# Patient Record
Sex: Male | Born: 1990 | Race: White | Hispanic: No | Marital: Single | Smoking: Light tobacco smoker
Health system: Southern US, Community
[De-identification: ages and names within clinical notes are randomized; demographics above are authoritative.]

## PROBLEM LIST (undated history)

## (undated) DIAGNOSIS — F419 Anxiety disorder, unspecified: Secondary | ICD-10-CM

## (undated) HISTORY — DX: Anxiety disorder, unspecified: F41.9

---

## 2019-08-01 ENCOUNTER — Other Ambulatory Visit: Payer: Self-pay

## 2019-08-01 ENCOUNTER — Emergency Department (HOSPITAL_COMMUNITY)
Admission: EM | Admit: 2019-08-01 | Discharge: 2019-08-01 | Disposition: A | Discharge status: Home / Self Care | Payer: No Typology Code available for payment source | Attending: Emergency Medicine | Admitting: Emergency Medicine

## 2019-08-01 ENCOUNTER — Encounter (HOSPITAL_COMMUNITY): Payer: Self-pay | Admitting: *Deleted

## 2019-08-01 DIAGNOSIS — R002 Palpitations: Secondary | ICD-10-CM | POA: Diagnosis not present

## 2019-08-01 DIAGNOSIS — R1013 Epigastric pain: Secondary | ICD-10-CM | POA: Diagnosis not present

## 2019-08-01 DIAGNOSIS — Z720 Tobacco use: Secondary | ICD-10-CM | POA: Diagnosis not present

## 2019-08-01 DIAGNOSIS — R112 Nausea with vomiting, unspecified: Secondary | ICD-10-CM | POA: Diagnosis not present

## 2019-08-01 DIAGNOSIS — R0602 Shortness of breath: Secondary | ICD-10-CM | POA: Diagnosis not present

## 2019-08-01 NOTE — ED Provider Notes (Signed)
[facility]-EMERGENCY DEPT Provider Note: Lowella Dell, MD, FACEP  CSN: [id number] MRN: [id number] ARRIVAL: 08/01/19 at 0149 ROOM: WA12/WA12   CHIEF COMPLAINT  Palpitations   HISTORY OF PRESENT ILLNESS  08/01/19 3:05 AM Ross Mcbride is a 29 y.o. male who has been having episodes of palpitations for several months.  He describes the palpitations as a sensation of his heart beating rapidly.  His Fitbit has shown his heart rate to be as high as 210.  He has had several visits to other hospitals but they were never able to capture an arrhythmia when it occurred.  He was fitted with a heart monitor recently at a [facility] and he believes the monitor captured an episode.  He returned the monitor to the [place] yesterday but has not yet gotten results.  He is here this morning because he was watching TV earlier and had an episode of diarrhea.  This was followed by a "jolt" (which he describes as a sensation of a lump in his throat) in his chest which he describes as a 10 out of 10 which was followed by about 30 minutes of his heart beating in about 150.  He was short of breath as well.  He had some nausea and vomiting during this event.  His symptoms abated before arrival and he has had no further palpitations while in the ED.  He still have some epigastric discomfort which is mild.   History reviewed. No pertinent past medical history.  History reviewed. No pertinent surgical history.  No family history on file.  Social History   Tobacco Use  . Smoking status: Light Tobacco Smoker  Substance Use Topics  . Alcohol use: Not Currently  . Drug use: Not Currently    Prior to Admission medications   Not on File    Allergies Patient has no known allergies.   REVIEW OF SYSTEMS  Negative except as noted here or in the History of Present Illness.   PHYSICAL EXAMINATION  Initial Vital Signs Blood pressure 134/88, pulse 100, temperature 97.7 F (36.5 C), temperature source Oral,  resp. rate 18, height 5\' 11"  (1.803 m), weight 82.6 kg, SpO2 99 %.  Examination General: Well-developed, well-nourished male in no acute distress; appearance consistent with age of record HENT: normocephalic; atraumatic Eyes: pupils equal, round and reactive to light; extraocular muscles intact Neck: supple Heart: regular rate and rhythm; no murmur Lungs: clear to auscultation bilaterally Abdomen: soft; nondistended; mild subxiphoid tenderness; no masses or hepatosplenomegaly; bowel sounds present Extremities: No deformity; full range of motion; pulses normal Neurologic: Awake, alert and oriented; motor function intact in all extremities and symmetric; no facial droop Skin: Warm and dry Psychiatric: Normal mood and affect   RESULTS  Summary of this visit's results, reviewed and interpreted by myself:   EKG Interpretation  Date/Time:  Sunday August 01 2019 01:59:09 EDT Ventricular Rate:  97 PR Interval:    QRS Duration: 105 QT Interval:  347 QTC Calculation: 441 R Axis:   85 Text Interpretation: Sinus rhythm Normal ECG No previous ECGs available Confirmed by Marshia Tropea ([id number]) on 08/01/2019 3:05:21 AM      Laboratory Studies: No results found for this or any previous visit (from the past 24 hour(s)). Imaging Studies: No results found.  ED COURSE and MDM  Nursing notes, initial and subsequent vitals signs, including pulse oximetry, reviewed and interpreted by myself.  Vitals:   08/01/19 0200 08/01/19 0202  BP:  134/88  Pulse:  100  Resp:  18  Temp:  97.7 F (36.5 C)  TempSrc:  Oral  SpO2:  99%  Weight: 82.6 kg   Height: 5\' 11"  (1.803 m)    Medications - No data to display  Patient does not wish to wait for TSH testing.  I do not believe a troponin is indicated as it is likely mildly elevated due to demand ischemia.  He is already had an echocardiogram which was unremarkable and, as noted above, has worn a monitor though the results are still pending.  He was advised  to return if the palpitations are actively occurring so we can capture an image of his rhythm.  SVT would be the most likely etiology in his demographics, ventricular tachycardia would be much less likely.  PROCEDURES  Procedures   ED DIAGNOSES     ICD-10-CM   1. Palpitations  R00.2        Wren Pryce, Jenny Reichmann, MD 08/01/19 [phone]

## 2019-08-01 NOTE — ED Triage Notes (Signed)
Pt reports he was watching TV, had diarrhea, "felt a jolt in my chest", had 1 episode of vomiting. After vomiting, he says that his heart rate was 150 and stayed elevated over 30 minutes. Pt says that about 3 weeks ago, he had similar symptoms, was seen at a hospital in [place] and was given medication for anxiety.

## 2019-08-05 ENCOUNTER — Emergency Department (HOSPITAL_COMMUNITY)
Admission: EM | Admit: 2019-08-05 | Discharge: 2019-08-05 | Discharge status: Against Medical Advice | Payer: No Typology Code available for payment source | Attending: Emergency Medicine | Admitting: Emergency Medicine

## 2019-08-05 ENCOUNTER — Encounter (HOSPITAL_COMMUNITY): Payer: Self-pay

## 2019-08-05 ENCOUNTER — Emergency Department (HOSPITAL_COMMUNITY)
Admission: EM | Admit: 2019-08-05 | Discharge: 2019-08-05 | Disposition: A | Discharge status: Home / Self Care | Payer: No Typology Code available for payment source | Source: Home / Self Care

## 2019-08-05 DIAGNOSIS — M546 Pain in thoracic spine: Secondary | ICD-10-CM | POA: Insufficient documentation

## 2019-08-05 DIAGNOSIS — R197 Diarrhea, unspecified: Secondary | ICD-10-CM | POA: Diagnosis not present

## 2019-08-05 DIAGNOSIS — Z5321 Procedure and treatment not carried out due to patient leaving prior to being seen by health care provider: Secondary | ICD-10-CM | POA: Diagnosis not present

## 2019-08-05 DIAGNOSIS — R200 Anesthesia of skin: Secondary | ICD-10-CM | POA: Insufficient documentation

## 2019-08-05 DIAGNOSIS — M549 Dorsalgia, unspecified: Secondary | ICD-10-CM | POA: Diagnosis not present

## 2019-08-05 DIAGNOSIS — R1110 Vomiting, unspecified: Secondary | ICD-10-CM | POA: Insufficient documentation

## 2019-08-05 LAB — COMPREHENSIVE METABOLIC PANEL
ALT: 20 U/L (ref 0–44)
AST: 18 U/L (ref 15–41)
Albumin: 5.1 g/dL — ABNORMAL HIGH (ref 3.5–5.0)
Alkaline Phosphatase: 85 U/L (ref 38–126)
Anion gap: 10 (ref 5–15)
BUN: 11 mg/dL (ref 6–20)
CO2: 28 mmol/L (ref 22–32)
Calcium: 9.8 mg/dL (ref 8.9–10.3)
Chloride: 102 mmol/L (ref 98–111)
Creatinine, Ser: 1.09 mg/dL (ref 0.61–1.24)
GFR calc Af Amer: >60 mL/min (ref >60)
GFR calc non Af Amer: >60 mL/min (ref >60)
Glucose, Bld: 97 mg/dL (ref 70–99)
Potassium: 4.5 mmol/L (ref 3.5–5.1)
Sodium: 140 mmol/L (ref 135–145)
Total Bilirubin: 1 mg/dL (ref 0.3–1.2)
Total Protein: 8 g/dL (ref 6.5–8.1)

## 2019-08-05 LAB — CBC
HCT: 46.8 % (ref 39.0–52.0)
Hemoglobin: 15.5 g/dL (ref 13.0–17.0)
MCH: 29.4 pg (ref 26.0–34.0)
MCHC: 33.1 g/dL (ref 30.0–36.0)
MCV: 88.8 fL (ref 80.0–100.0)
Platelets: 259 10*3/uL (ref 150–400)
RBC: 5.27 MIL/uL (ref 4.22–5.81)
RDW: 12.3 % (ref 11.5–15.5)
WBC: 7.5 10*3/uL (ref 4.0–10.5)
nRBC: 0 % (ref 0.0–0.2)

## 2019-08-05 LAB — LIPASE, BLOOD: Lipase: 37 U/L (ref 11–51)

## 2019-08-05 NOTE — ED Notes (Signed)
Patient has a extra gold top in the main lab 

## 2019-08-05 NOTE — ED Triage Notes (Signed)
Patient left this facility a few hours ago. Patient reports back pain, rib pain, and more recently nausea and vomiting within the last 2 hours.

## 2019-08-05 NOTE — ED Triage Notes (Signed)
Arrived POV from home. Patient reports Thoracic back pain and numbness that started when he was getting ready for bed. Patient reports pain radiates into ribs.

## 2019-08-11 ENCOUNTER — Other Ambulatory Visit: Payer: Self-pay

## 2019-08-11 ENCOUNTER — Emergency Department (HOSPITAL_COMMUNITY)
Admission: EM | Admit: 2019-08-11 | Discharge: 2019-08-12 | Discharge status: Against Medical Advice | Payer: No Typology Code available for payment source

## 2019-08-12 NOTE — ED Triage Notes (Signed)
Pt called from triage with no answer and not seen in the lobby

## 2019-09-08 ENCOUNTER — Encounter (HOSPITAL_COMMUNITY): Payer: Self-pay

## 2019-09-08 ENCOUNTER — Emergency Department (HOSPITAL_COMMUNITY)
Admission: EM | Admit: 2019-09-08 | Discharge: 2019-09-08 | Disposition: A | Discharge status: Home / Self Care | Payer: No Typology Code available for payment source | Attending: Emergency Medicine | Admitting: Emergency Medicine

## 2019-09-08 ENCOUNTER — Other Ambulatory Visit: Payer: Self-pay

## 2019-09-08 DIAGNOSIS — Z5321 Procedure and treatment not carried out due to patient leaving prior to being seen by health care provider: Secondary | ICD-10-CM | POA: Insufficient documentation

## 2019-09-08 DIAGNOSIS — R1011 Right upper quadrant pain: Secondary | ICD-10-CM | POA: Diagnosis not present

## 2019-09-08 LAB — COMPREHENSIVE METABOLIC PANEL
ALT: 29 U/L (ref 0–44)
AST: 26 U/L (ref 15–41)
Albumin: 4.9 g/dL (ref 3.5–5.0)
Alkaline Phosphatase: 78 U/L (ref 38–126)
Anion gap: 12 (ref 5–15)
BUN: 10 mg/dL (ref 6–20)
CO2: 25 mmol/L (ref 22–32)
Calcium: 9.6 mg/dL (ref 8.9–10.3)
Chloride: 100 mmol/L (ref 98–111)
Creatinine, Ser: 0.93 mg/dL (ref 0.61–1.24)
GFR calc Af Amer: >60 mL/min (ref >60)
GFR calc non Af Amer: >60 mL/min (ref >60)
Glucose, Bld: 98 mg/dL (ref 70–99)
Potassium: 3.5 mmol/L (ref 3.5–5.1)
Sodium: 137 mmol/L (ref 135–145)
Total Bilirubin: 1 mg/dL (ref 0.3–1.2)
Total Protein: 7.5 g/dL (ref 6.5–8.1)

## 2019-09-08 LAB — CBC
HCT: 44.3 % (ref 39.0–52.0)
Hemoglobin: 14.6 g/dL (ref 13.0–17.0)
MCH: 29.3 pg (ref 26.0–34.0)
MCHC: 33 g/dL (ref 30.0–36.0)
MCV: 88.8 fL (ref 80.0–100.0)
Platelets: 222 10*3/uL (ref 150–400)
RBC: 4.99 MIL/uL (ref 4.22–5.81)
RDW: 12.4 % (ref 11.5–15.5)
WBC: 6.5 10*3/uL (ref 4.0–10.5)
nRBC: 0 % (ref 0.0–0.2)

## 2019-09-08 LAB — URINALYSIS, ROUTINE W REFLEX MICROSCOPIC
Bilirubin Urine: NEGATIVE
Glucose, UA: NEGATIVE mg/dL
Ketones, ur: NEGATIVE mg/dL
Leukocytes,Ua: NEGATIVE
Nitrite: NEGATIVE
Protein, ur: NEGATIVE mg/dL
Specific Gravity, Urine: 1.006 (ref 1.005–1.030)
pH: 7 (ref 5.0–8.0)

## 2019-09-08 LAB — LIPASE, BLOOD: Lipase: 41 U/L (ref 11–51)

## 2019-09-08 NOTE — ED Triage Notes (Signed)
Pt sts RUQ abdominal pain for 4 months.

## 2019-09-08 NOTE — ED Notes (Signed)
Pt told greeters he couldn't wait any longer and he was leaving

## 2019-09-08 NOTE — ED Triage Notes (Signed)
Pt sts RUQ abdominal pain for 4 months and thinks he has worms because his butt itches.

## 2019-09-22 ENCOUNTER — Emergency Department (HOSPITAL_COMMUNITY): Payer: No Typology Code available for payment source

## 2019-09-22 ENCOUNTER — Encounter (HOSPITAL_COMMUNITY): Payer: Self-pay | Admitting: Obstetrics and Gynecology

## 2019-09-22 ENCOUNTER — Other Ambulatory Visit: Payer: Self-pay

## 2019-09-22 ENCOUNTER — Emergency Department (HOSPITAL_COMMUNITY)
Admission: EM | Admit: 2019-09-22 | Discharge: 2019-09-22 | Disposition: A | Discharge status: Home / Self Care | Payer: No Typology Code available for payment source | Attending: Emergency Medicine | Admitting: Emergency Medicine

## 2019-09-22 DIAGNOSIS — R0789 Other chest pain: Secondary | ICD-10-CM | POA: Insufficient documentation

## 2019-09-22 DIAGNOSIS — Z5321 Procedure and treatment not carried out due to patient leaving prior to being seen by health care provider: Secondary | ICD-10-CM | POA: Diagnosis not present

## 2019-09-22 MED ORDER — SODIUM CHLORIDE 0.9% FLUSH: 3.0000 mL | Freq: Once | INTRAVENOUS | Status: DC

## 2019-09-22 NOTE — ED Triage Notes (Signed)
Pt reports right sided chest pain. Patient reports he wore a heart monitor for 2 weeks and they didn't find anything.

## 2019-11-09 ENCOUNTER — Emergency Department (HOSPITAL_COMMUNITY)
Admission: EM | Admit: 2019-11-09 | Discharge: 2019-11-09 | Disposition: A | Discharge status: Home / Self Care | Payer: No Typology Code available for payment source | Attending: Emergency Medicine | Admitting: Emergency Medicine

## 2019-11-09 ENCOUNTER — Other Ambulatory Visit: Payer: Self-pay

## 2019-11-09 ENCOUNTER — Encounter (HOSPITAL_COMMUNITY): Payer: Self-pay | Admitting: Emergency Medicine

## 2019-11-09 ENCOUNTER — Emergency Department (HOSPITAL_COMMUNITY): Payer: No Typology Code available for payment source

## 2019-11-09 DIAGNOSIS — F419 Anxiety disorder, unspecified: Secondary | ICD-10-CM | POA: Diagnosis not present

## 2019-11-09 DIAGNOSIS — Z5321 Procedure and treatment not carried out due to patient leaving prior to being seen by health care provider: Secondary | ICD-10-CM | POA: Insufficient documentation

## 2019-11-09 DIAGNOSIS — R0789 Other chest pain: Secondary | ICD-10-CM | POA: Diagnosis present

## 2019-11-09 NOTE — ED Notes (Signed)
Pt has stated that he is going to leave before evaluation. He states that he believes it was a panic attack and feels much better now. He has had them previously and he says the pain and symptoms were the same. Pt advised that the wait time was less than 15 minutes to see a provider. Pt refused and left the property. Advised to return if needed

## 2019-11-09 NOTE — ED Triage Notes (Signed)
Pt reports waking from sleep with tightness in chest and pins and needles all over. Pt states it felt like heart was racing. Pt reports taking medications for anxiety.

## 2020-01-27 ENCOUNTER — Emergency Department (HOSPITAL_COMMUNITY): Payer: BC Managed Care – PPO

## 2020-01-27 ENCOUNTER — Other Ambulatory Visit: Payer: Self-pay

## 2020-01-27 ENCOUNTER — Emergency Department (HOSPITAL_COMMUNITY)
Admission: EM | Admit: 2020-01-27 | Discharge: 2020-01-27 | Disposition: A | Discharge status: Home / Self Care | Payer: BC Managed Care – PPO | Attending: Emergency Medicine | Admitting: Emergency Medicine

## 2020-01-27 DIAGNOSIS — F17210 Nicotine dependence, cigarettes, uncomplicated: Secondary | ICD-10-CM | POA: Diagnosis not present

## 2020-01-27 DIAGNOSIS — R5381 Other malaise: Secondary | ICD-10-CM | POA: Insufficient documentation

## 2020-01-27 DIAGNOSIS — R0602 Shortness of breath: Secondary | ICD-10-CM | POA: Diagnosis not present

## 2020-01-27 DIAGNOSIS — R6889 Other general symptoms and signs: Secondary | ICD-10-CM

## 2020-01-27 DIAGNOSIS — R6883 Chills (without fever): Secondary | ICD-10-CM | POA: Diagnosis present

## 2020-01-27 DIAGNOSIS — R202 Paresthesia of skin: Secondary | ICD-10-CM | POA: Insufficient documentation

## 2020-01-27 LAB — CBC
HCT: 43.5 % (ref 39.0–52.0)
Hemoglobin: 14.4 g/dL (ref 13.0–17.0)
MCH: 29.3 pg (ref 26.0–34.0)
MCHC: 33.1 g/dL (ref 30.0–36.0)
MCV: 88.4 fL (ref 80.0–100.0)
Platelets: 214 10*3/uL (ref 150–400)
RBC: 4.92 MIL/uL (ref 4.22–5.81)
RDW: 12.6 % (ref 11.5–15.5)
WBC: 6.5 10*3/uL (ref 4.0–10.5)
nRBC: 0 % (ref 0.0–0.2)

## 2020-01-27 LAB — BASIC METABOLIC PANEL
Anion gap: 10 (ref 5–15)
BUN: 10 mg/dL (ref 6–20)
CO2: 27 mmol/L (ref 22–32)
Calcium: 9.3 mg/dL (ref 8.9–10.3)
Chloride: 100 mmol/L (ref 98–111)
Creatinine, Ser: 0.84 mg/dL (ref 0.61–1.24)
GFR, Estimated: >60 mL/min (ref >60)
Glucose, Bld: 97 mg/dL (ref 70–99)
Potassium: 3.7 mmol/L (ref 3.5–5.1)
Sodium: 137 mmol/L (ref 135–145)

## 2020-01-27 NOTE — Discharge Instructions (Signed)
Follow up with your family doc.  Return for symptoms that occur with exercising, or if you pass out.

## 2020-01-27 NOTE — ED Provider Notes (Signed)
[facility]-EMERGENCY DEPT Provider Note   CSN: [id number] Arrival date & time: 01/27/20  0457     History Chief Complaint  Patient presents with  . Chest Pain    Ross Mcbride is a 29 y.o. male.  29 yo M with a chief complaint of feeling like his whole body is cold from his head down to his toes.  This typically occurs right as he is trying to go to sleep.  This happened multiple times to him and usually resolves by the time he shows up in the emergency department.  Same thing occurred tonight.  Lasted for about 20 minutes in total.  By the time he got here it resolved.  He has seen his family doctor as well as his psychiatrist for this.  He has been given different breathing routines to try.  He was recently started on trazodone couple weeks ago to try and help him go to sleep.  Has been on venlafaxine.  Prior to this episode he denies any recent illness.  Denied any symptoms prior to this event occurring.  Denies cough congestion or fever denies nausea vomiting.  Has had some diarrhea off and on.  Thinks is related to the food that he eats.  Denies recent dietary change.  Has had lab work done recently by his family doctor that showed a metabolic alkalosis and a mildly low B12 and vitamin D level.  The history is provided by the patient.  Chest Pain Associated symptoms: numbness (feels cold all over) and shortness of breath   Associated symptoms: no abdominal pain, no fever, no headache, no palpitations and no vomiting   Illness Severity:  Moderate Onset quality:  Sudden Duration:  20 minutes Timing:  Constant Progression:  Resolved Chronicity:  Recurrent Associated symptoms: shortness of breath   Associated symptoms: no abdominal pain, no chest pain, no congestion, no diarrhea, no fever, no headaches, no myalgias, no rash and no vomiting        Past Medical History:  Diagnosis Date  . Anxiety     There are no problems to display for this  patient.   No past surgical history on file.     No family history on file.  Social History   Tobacco Use  . Smoking status: Light Tobacco Smoker    Types: Cigarettes  . Smokeless tobacco: Never Used  Substance Use Topics  . Alcohol use: Not Currently  . Drug use: Not Currently    Home Medications Prior to Admission medications   Not on File    Allergies    Patient has no known allergies.  Review of Systems   Review of Systems  Constitutional: Negative for chills and fever.  HENT: Negative for congestion and facial swelling.   Eyes: Negative for discharge and visual disturbance.  Respiratory: Positive for shortness of breath.   Cardiovascular: Negative for chest pain and palpitations.  Gastrointestinal: Negative for abdominal pain, diarrhea and vomiting.  Musculoskeletal: Negative for arthralgias and myalgias.  Skin: Negative for color change and rash.  Neurological: Positive for numbness (feels cold all over). Negative for tremors, syncope and headaches.  Psychiatric/Behavioral: Negative for confusion and dysphoric mood.    Physical Exam Updated Vital Signs BP 122/78 (BP Location: Right Arm)   Pulse 65   Temp 98.4 F (36.9 C) (Oral)   Resp 17   Ht 6' (1.829 m)   Wt 81.6 kg   SpO2 98%   BMI 24.41 kg/m   Physical Exam Vitals  and nursing note reviewed.  Constitutional:      Appearance: He is well-developed and well-nourished.  HENT:     Head: Normocephalic and atraumatic.  Eyes:     Extraocular Movements: EOM normal.     Pupils: Pupils are equal, round, and reactive to light.  Neck:     Vascular: No JVD.  Cardiovascular:     Rate and Rhythm: Normal rate and regular rhythm.     Heart sounds: No murmur heard. No friction rub. No gallop.   Pulmonary:     Effort: No respiratory distress.     Breath sounds: No wheezing.  Abdominal:     General: There is no distension.     Tenderness: There is no guarding or rebound.  Musculoskeletal:         General: Normal range of motion.     Cervical back: Normal range of motion and neck supple.  Skin:    Coloration: Skin is not pale.     Findings: No rash.  Neurological:     Mental Status: He is alert and oriented to person, place, and time.  Psychiatric:        Mood and Affect: Mood and affect normal.        Behavior: Behavior normal.     ED Results / Procedures / Treatments   Labs (all labs ordered are listed, but only abnormal results are displayed) Labs Reviewed  BASIC METABOLIC PANEL  CBC    EKG EKG Interpretation  Date/Time:  Thursday January 27 2020 05:24:17 EST Ventricular Rate:  64 PR Interval:    QRS Duration: 106 QT Interval:  421 QTC Calculation: 435 R Axis:   74 Text Interpretation: Sinus rhythm 12 Lead; [place]ar No significant change since last tracing Confirmed by Melene Plan [phone]) on 01/27/2020 5:37:50 AM   Radiology No results found.  Procedures Procedures (including critical care time)  Medications Ordered in ED Medications - No data to display  ED Course  I have reviewed the triage vital signs and the nursing notes.  Pertinent labs & imaging results that were available during my care of the patient were reviewed by me and considered in my medical decision making (see chart for details).    MDM Rules/Calculators/A&P                          29 yo M with a chief complaints of events where he suddenly feels cold all over and has some trouble breathing.  This occurs fairly frequently.  He had had a medication change and it had resolved for about 6 months or so and then reoccurred this evening.  Sounds like an acute panic event.  He did have a metabolic alkalosis that is most recent PCP visit.  Will obtain a basic laboratory evaluation.  Seems unlikely to be ACS do not feel like a troponin is warranted.  No significant shortness of breath or cough currently I feel chest x-ray be unhelpful.  Labwork without abnormality.  D/c home.   6:11 AM:  I  have discussed the diagnosis/risks/treatment options with the patient and believe the pt to be eligible for discharge home to follow-up with PCP. We also discussed returning to the ED immediately if new or worsening sx occur. We discussed the sx which are most concerning (e.g., sudden worsening pain, fever, inability to tolerate by mouth) that necessitate immediate return. Medications administered to the patient during their visit and any new prescriptions provided to the  patient are listed below.  Medications given during this visit Medications - No data to display   The patient appears reasonably screen and/or stabilized for discharge and I doubt any other medical condition or other [facility] requiring further screening, evaluation, or treatment in the ED at this time prior to discharge.  Final Clinical Impression(s) / ED Diagnoses Final diagnoses:  Feeling unwell    Rx / DC Orders ED Discharge Orders    None       Melene Plan, DO 01/27/20 [phone]

## 2020-01-27 NOTE — ED Triage Notes (Signed)
Patient complaining of chest pain all over this morning. Patient states it woke him out of his sleep. Patient states he was feeling dizzy and having a cold sensation on his body.

## 2021-01-13 IMAGING — CR DG CHEST 2V
2 series · 2 of 2 positions shown · non-contrast
Comparison: None.

CLINICAL DATA: Chest pain

EXAM:
CHEST - 2 VIEW

[w chest pa]
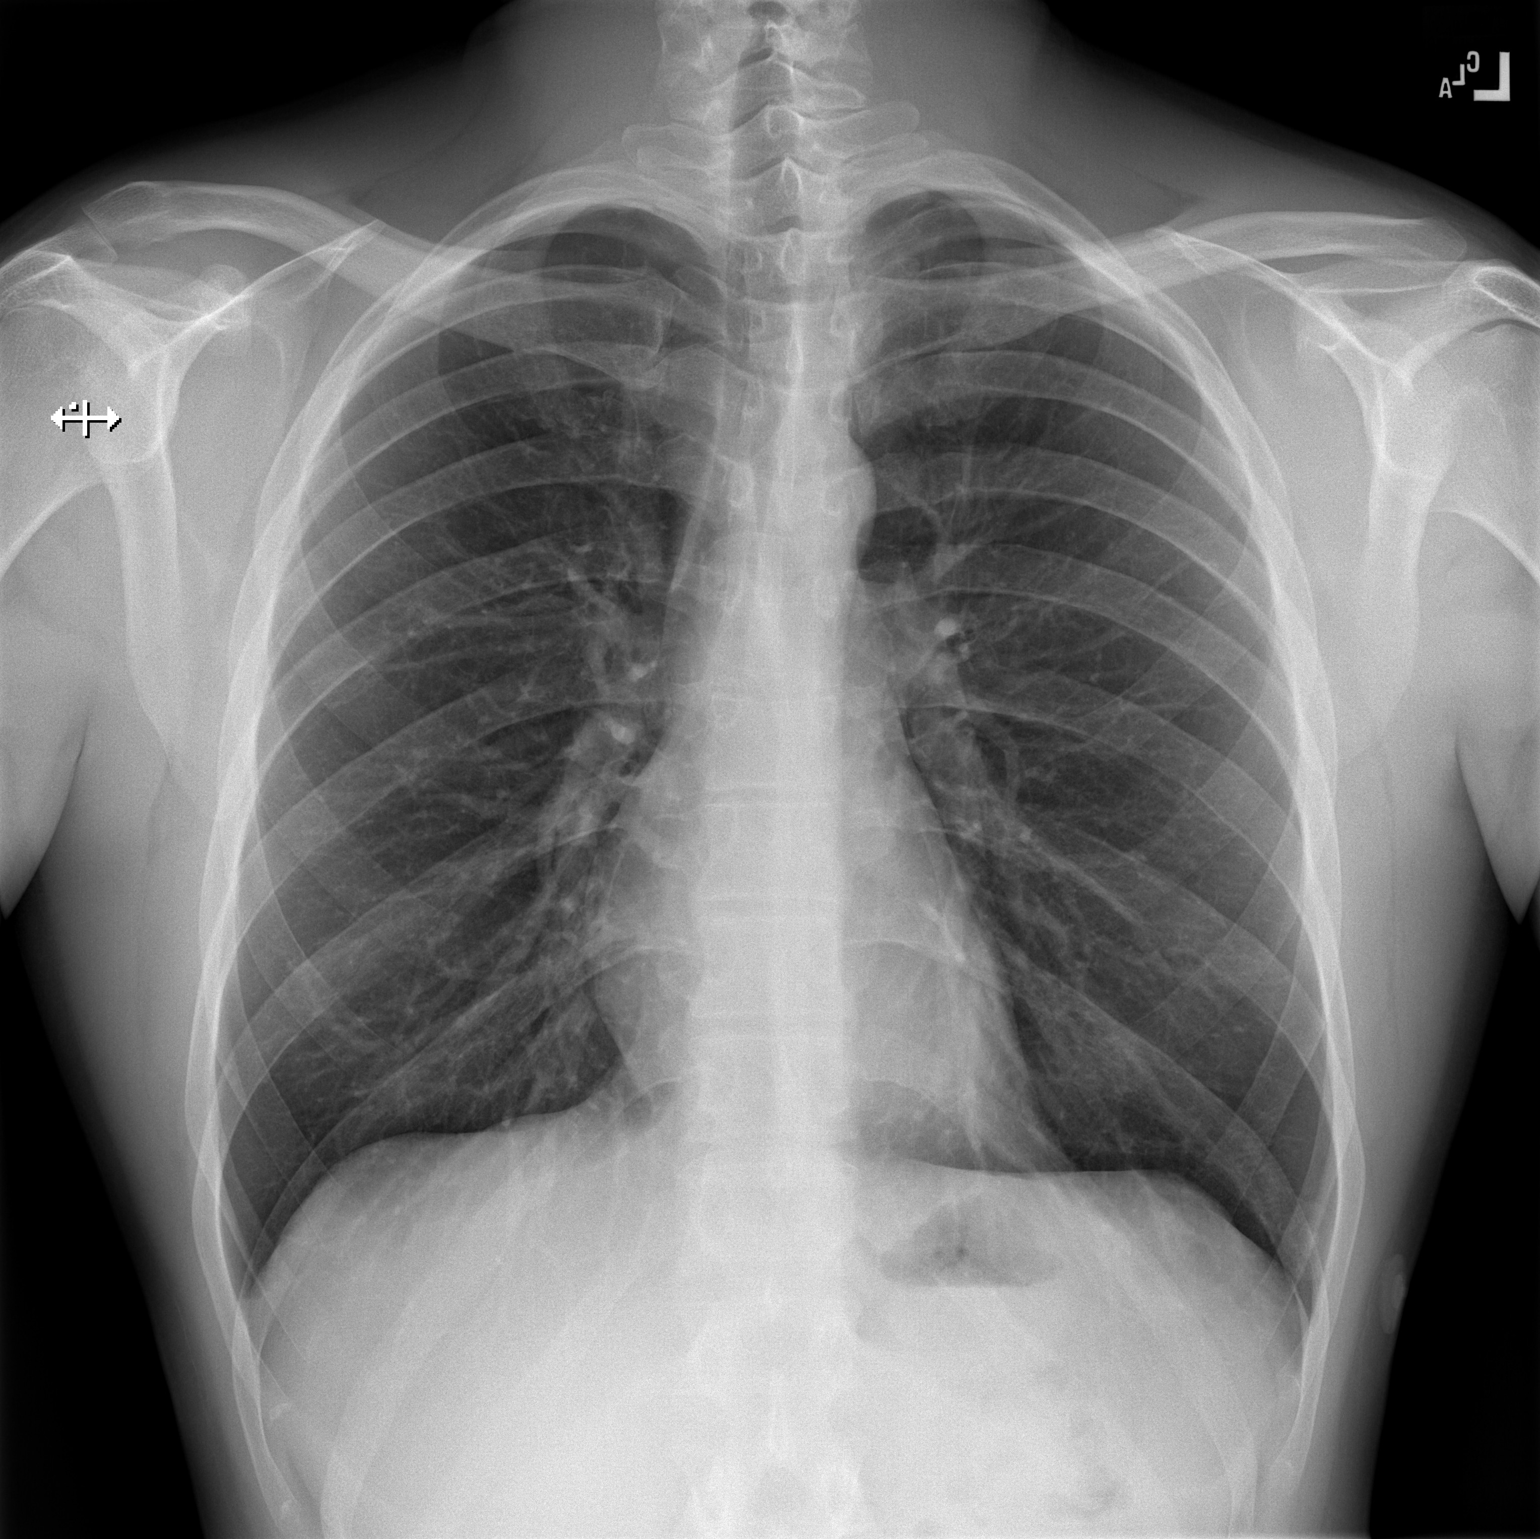

[w chest lat]
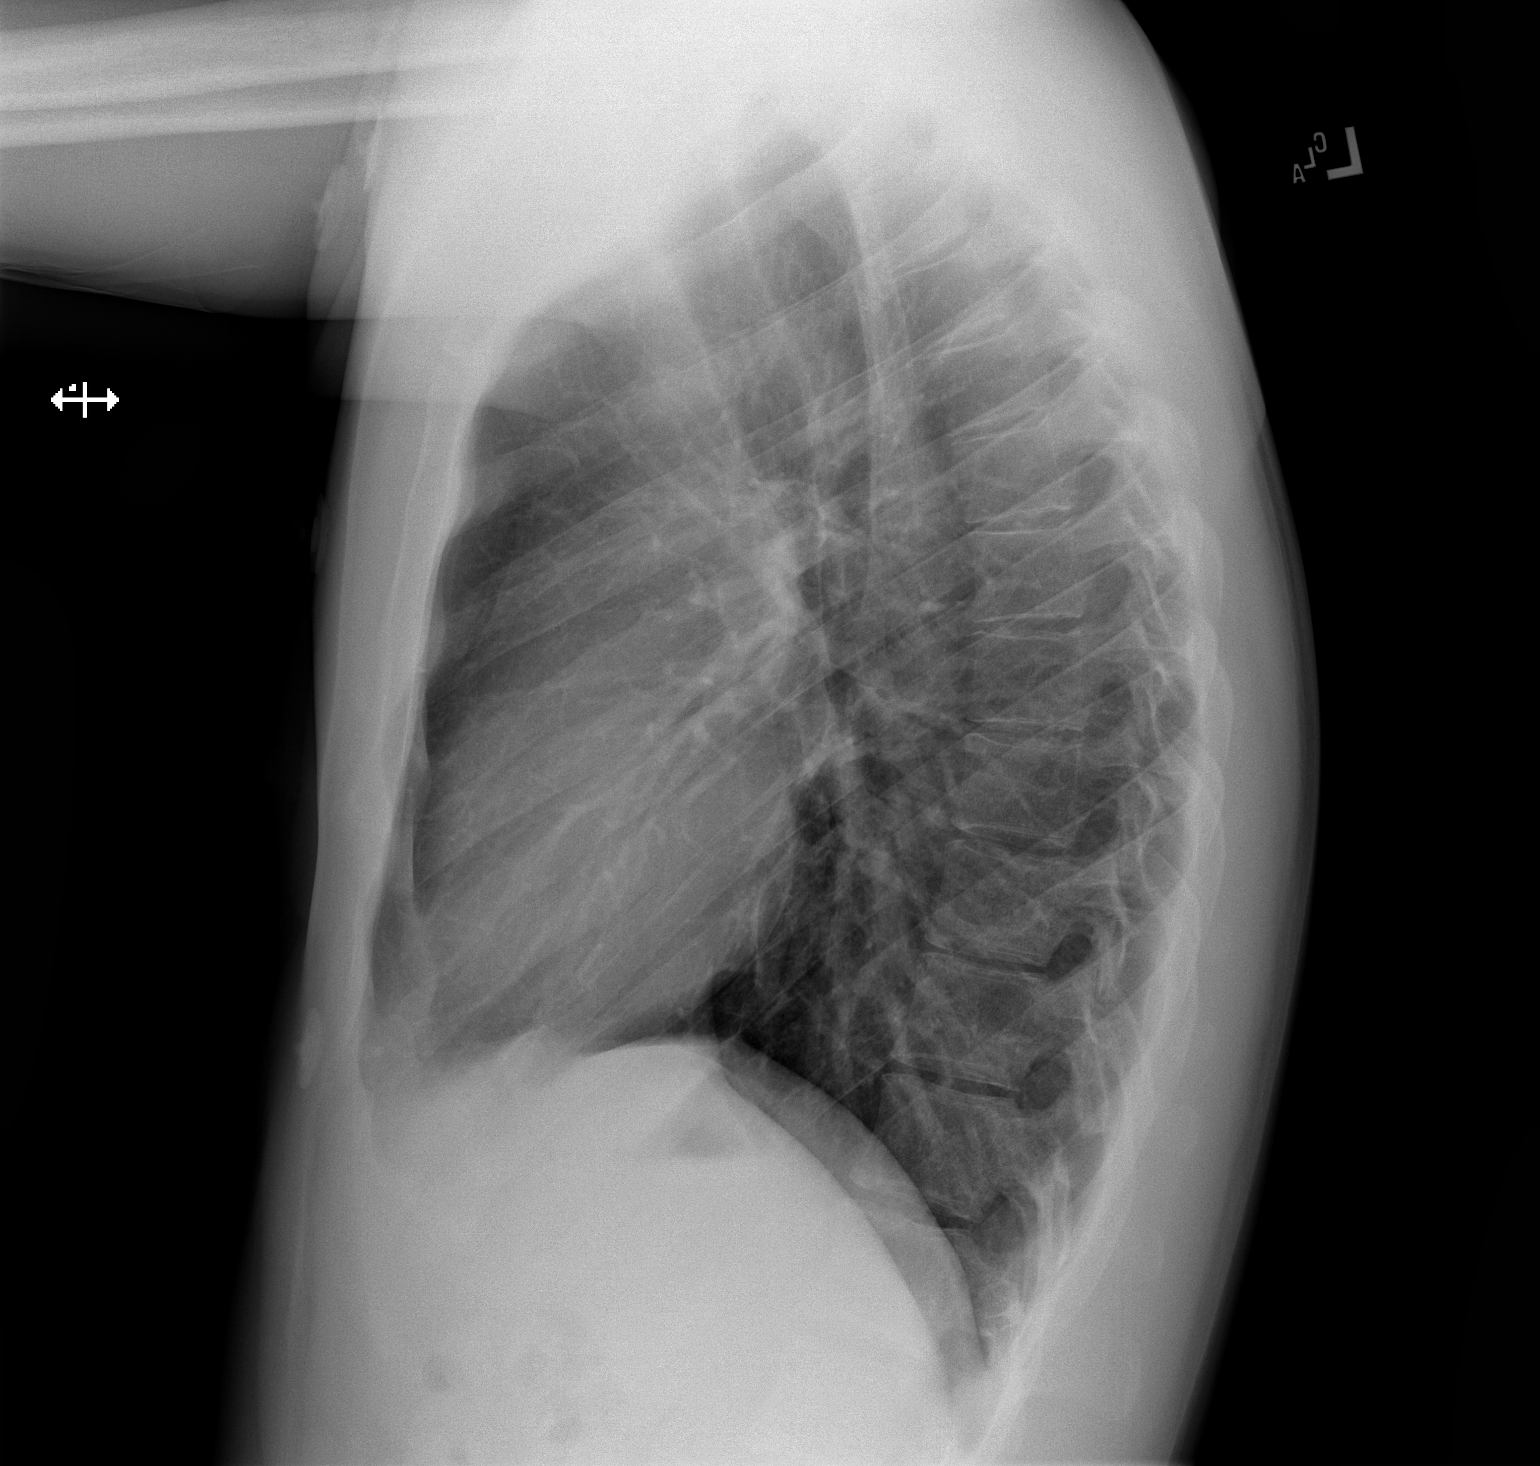

[2 of 2 positions shown; findings below may reference images not displayed]

FINDINGS: The heart size and mediastinal contours are within normal limits.
Both lungs are clear. No pneumothorax or pleural effusion. The
visualized skeletal structures are unremarkable.
IMPRESSION: No focal airspace disease.
